# Patient Record
Sex: Male | Born: 1965 | Race: White | Hispanic: No | Marital: Married | Smoking: Former smoker
Health system: Southern US, Community
[De-identification: ages and names within clinical notes are randomized; demographics above are authoritative.]

## PROBLEM LIST (undated history)

## (undated) DIAGNOSIS — B9562 Methicillin resistant Staphylococcus aureus infection as the cause of diseases classified elsewhere: Secondary | ICD-10-CM

## (undated) DIAGNOSIS — L0390 Cellulitis, unspecified: Secondary | ICD-10-CM

## (undated) DIAGNOSIS — K219 Gastro-esophageal reflux disease without esophagitis: Secondary | ICD-10-CM

## (undated) DIAGNOSIS — Z8719 Personal history of other diseases of the digestive system: Secondary | ICD-10-CM

## (undated) DIAGNOSIS — I319 Disease of pericardium, unspecified: Secondary | ICD-10-CM

## (undated) HISTORY — DX: Personal history of other diseases of the digestive system: Z87.19

## (undated) HISTORY — DX: Cellulitis, unspecified: L03.90

## (undated) HISTORY — PX: HEMORRHOID SURGERY: SHX153

## (undated) HISTORY — DX: Disease of pericardium, unspecified: I31.9

## (undated) HISTORY — DX: Methicillin resistant Staphylococcus aureus infection as the cause of diseases classified elsewhere: B95.62

## (undated) HISTORY — DX: Gastro-esophageal reflux disease without esophagitis: K21.9

---

## 2006-01-05 DIAGNOSIS — B9562 Methicillin resistant Staphylococcus aureus infection as the cause of diseases classified elsewhere: Secondary | ICD-10-CM

## 2006-01-05 HISTORY — DX: Methicillin resistant Staphylococcus aureus infection as the cause of diseases classified elsewhere: B95.62

## 2011-01-06 HISTORY — PX: SIGMOIDOSCOPY: SUR1295

## 2011-01-06 LAB — HM COLONOSCOPY

## 2011-01-29 LAB — HM SIGMOIDOSCOPY

## 2013-10-04 ENCOUNTER — Ambulatory Visit (INDEPENDENT_AMBULATORY_CARE_PROVIDER_SITE_OTHER): Payer: 59 | Admitting: Internal Medicine

## 2013-10-04 ENCOUNTER — Encounter: Payer: Self-pay | Admitting: Internal Medicine

## 2013-10-04 VITALS — BP 114/78 | HR 69 | Temp 98.8°F | Resp 14 | Ht 66.5 in | Wt 250.0 lb

## 2013-10-04 DIAGNOSIS — Z0000 Encounter for general adult medical examination without abnormal findings: Secondary | ICD-10-CM | POA: Insufficient documentation

## 2013-10-04 DIAGNOSIS — K219 Gastro-esophageal reflux disease without esophagitis: Secondary | ICD-10-CM

## 2013-10-04 NOTE — Progress Notes (Signed)
Subjective:    Patient ID: Carl Ramos Needs, male    DOB: 1965/02/22, 48 y.o.   MRN: [id number]  HPI Here to establish Moved here from [place] in April Took job here with [employer]  Has had stress with career change, moving, etc Had been awakening with reflux and breathing problems MD did barium swallow -- diagnosed with acid reflux Now sleeping elevated Has been taking the omeprazole daily No swallowing problems  Has had some hemorrhoids removed--- ~10 years ago Recurrent blood in stool--had sigmoidoscopy then--nothing worrisome found (2014)  No current outpatient prescriptions on file prior to visit.   No current facility-administered medications on file prior to visit.    No Known Allergies  Past Medical History  Diagnosis Date  . History of hemorrhoids   . GERD (gastroesophageal reflux disease)   . MRSA cellulitis 2008    hospitalized for presumed MRSA infection    Past Surgical History  Procedure Laterality Date  . Hemorrhoid surgery  ~2005    Family History  Problem Relation Age of Onset  . Cancer Mother 1862    colon  . COPD Mother   . Early death Father [id number]  . Aneurysm Father     cerebral  . Heart disease Maternal Grandfather   . Diabetes Paternal Grandmother     History   Social History  . Marital Status: Married    Spouse Name: N/A    Number of Children: 2  . 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 Smoking status: Former Games developermoker  . Smokeless tobacco: Never Used  . Alcohol Use: Yes  . Drug Use: No  . Sexual Activity: Not on file   Other Topics Concern  . Not on file   Social History Narrative  . No narrative on file   Review of Systems  Constitutional: Positive for unexpected weight change. Negative for fatigue.       Has gained 10# since the move here Wears seat belt  HENT: Negative for dental problem, hearing loss and tinnitus.        Regular with dentist  Eyes: Negative for  visual disturbance.       No diplopia or unilateral vision loss  Respiratory: Negative for cough, chest tightness and shortness of breath.   Cardiovascular: Negative for chest pain, palpitations and leg swelling.  Gastrointestinal: Negative for nausea, vomiting, abdominal pain, constipation and blood in stool.  Endocrine: Negative for polydipsia and polyuria.  Genitourinary: Negative for urgency, frequency and difficulty urinating.       No sexual problems  Musculoskeletal: Negative for arthralgias, back pain and joint swelling.  Skin: Negative for rash.       No suspicious lesions  Allergic/Immunologic: Negative for environmental allergies and immunocompromised state.  Neurological: Negative for dizziness, syncope, weakness, light-headedness, numbness and headaches.  Psychiatric/Behavioral: Negative for sleep disturbance and dysphoric mood. The patient is not nervous/anxious.        Objective:   Physical Exam  Constitutional: He is oriented to person, place, and time. He appears well-developed and well-nourished. No distress.  HENT:  Head: Normocephalic and atraumatic.  Right Ear: External ear normal.  Left Ear: External ear normal.  Mouth/Throat: Oropharynx is clear and moist. No oropharyngeal exudate.  Eyes: Conjunctivae and EOM are normal. Pupils are equal, round, and reactive to light.  Neck: Normal range of motion. Neck supple. No thyromegaly present.  Cardiovascular: Normal rate, regular rhythm, normal heart sounds  and intact distal pulses.  Exam reveals no gallop.   No murmur heard. Pulmonary/Chest: Effort normal and breath sounds normal. No respiratory distress. He has no wheezes. He has no rales.  Abdominal: Soft. There is no tenderness.  Musculoskeletal: He exhibits no edema and no tenderness.  Lymphadenopathy:    He has no cervical adenopathy.  Neurological: He is alert and oriented to person, place, and time.  Skin: No rash noted. No erythema.  Psychiatric: He has a  normal mood and affect. His behavior is normal.          Assessment & Plan:

## 2013-10-04 NOTE — Progress Notes (Signed)
Pre visit review using our clinic review tool, if applicable. No additional management support is needed unless otherwise documented below in the visit note. 

## 2013-10-04 NOTE — Assessment & Plan Note (Signed)
Quiet now Discussed weaning down on the omeprazole

## 2013-10-04 NOTE — Assessment & Plan Note (Signed)
Healthy but needs to work on fitness UTD with Td Flu vaccine today He will get a copy of the recent labs he had

## 2014-01-09 ENCOUNTER — Encounter: Payer: Self-pay | Admitting: Internal Medicine

## 2014-01-12 ENCOUNTER — Encounter: Payer: Self-pay | Admitting: Internal Medicine

## 2014-06-29 ENCOUNTER — Telehealth: Payer: Self-pay | Admitting: Internal Medicine

## 2014-06-29 NOTE — Telephone Encounter (Signed)
Patient needs to have his physical done for work by the end of August.  Can Patient be fit in before September to see you or can he see another provider?

## 2014-06-30 NOTE — Telephone Encounter (Signed)
Please fit him in somewhere

## 2014-10-10 ENCOUNTER — Ambulatory Visit (INDEPENDENT_AMBULATORY_CARE_PROVIDER_SITE_OTHER): Payer: 59 | Admitting: Internal Medicine

## 2014-10-10 ENCOUNTER — Encounter: Payer: Self-pay | Admitting: Internal Medicine

## 2014-10-10 VITALS — BP 120/70 | HR 58 | Temp 97.7°F | Ht 67.0 in | Wt 251.0 lb

## 2014-10-10 DIAGNOSIS — K219 Gastro-esophageal reflux disease without esophagitis: Secondary | ICD-10-CM

## 2014-10-10 DIAGNOSIS — Z23 Encounter for immunization: Secondary | ICD-10-CM

## 2014-10-10 DIAGNOSIS — Z0000 Encounter for general adult medical examination without abnormal findings: Secondary | ICD-10-CM

## 2014-10-10 NOTE — Progress Notes (Signed)
Pre visit review using our clinic review tool, if applicable. No additional management support is needed unless otherwise documented below in the visit note. 

## 2014-10-10 NOTE — Addendum Note (Signed)
Addended by: Sueanne Margarita on: 10/10/2014 03:20 PM   Modules accepted: Orders

## 2014-10-10 NOTE — Assessment & Plan Note (Signed)
Quiet now Uses the prilosec prn

## 2014-10-10 NOTE — Progress Notes (Signed)
Subjective:    Patient ID: Carl Ramos, male    DOB: Dec 05, 1965, 49 y.o.   MRN: [id number]  HPI Here for physical Did have recent wellness at work---will get results for me  Recent vacation and gained 10# Working more on exercise and healthy eating  Still on omeprazole--but uses only prn No heartburn or dysphagia No nocturnal dyspnea now--elevates HOB with pillows  Current Outpatient Prescriptions on File Prior to Visit  Medication Sig Dispense Refill  . Multiple Vitamin (MULTIVITAMIN) tablet Take 1 tablet by mouth daily.    Marland Kitchen omeprazole (PRILOSEC) 20 MG capsule Take 20 mg by mouth daily.     No current facility-administered medications on file prior to visit.    No Known Allergies  Past Medical History  Diagnosis Date  . History of hemorrhoids   . GERD (gastroesophageal reflux disease)   . MRSA cellulitis 2008    hospitalized for presumed MRSA infection    Past Surgical History  Procedure Laterality Date  . Hemorrhoid surgery  ~2005  . Sigmoidoscopy  1/13    normal    Family History  Problem Relation Age of Onset  . Cancer Mother 77    colon  . COPD Mother   . Early death Father 41  . Aneurysm Father     cerebral  . Heart disease Maternal Grandfather   . Diabetes Paternal Grandmother     Social History   Social History  . Marital Status: Married    Spouse Name: N/A  . Number of Children: 2  . 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 Smoking status: Former Games developer  . Smokeless tobacco: Never Used  . Alcohol Use: Yes  . Drug Use: No  . Sexual Activity: Not on file   Other Topics Concern  . Not on file   Social History Narrative   Review of Systems  Constitutional: Negative for fatigue and unexpected weight change.       Wears seat belt  HENT: Negative for dental problem, hearing loss, tinnitus and trouble swallowing.        Keeps up with dentist  Eyes: Negative for visual  disturbance.       No diplopia or unilateral vision loss  Respiratory: Negative for cough, chest tightness and shortness of breath.   Cardiovascular: Negative for chest pain, palpitations and leg swelling.  Gastrointestinal: Positive for anal bleeding. Negative for nausea, vomiting, abdominal pain, constipation and blood in stool.       Brief rectal bleeding --- due to hemorrhoid Has had past colonoscopy  Endocrine: Negative for polydipsia and polyuria.  Genitourinary: Negative for urgency and difficulty urinating.       No sexual problems  Musculoskeletal: Negative for back pain, joint swelling and arthralgias.  Skin: Positive for rash.       Saw dermatologist--needed lesion frozen on forehead. Cream for dryness in feet  Allergic/Immunologic: Negative for environmental allergies and immunocompromised state.  Neurological: Negative for dizziness, syncope, weakness, light-headedness and headaches.  Hematological: Negative for adenopathy. Does not bruise/bleed easily.  Psychiatric/Behavioral: Negative for sleep disturbance and dysphoric mood. The patient is not nervous/anxious.        Objective:   Physical Exam  Constitutional: He is oriented to person, place, and time. He appears well-developed and well-nourished. No distress.  HENT:  Head: Normocephalic and atraumatic.  Right Ear: External ear normal.  Left Ear: External ear normal.  Mouth/Throat: Oropharynx is  clear and moist. No oropharyngeal exudate.  Eyes: Conjunctivae and EOM are normal. Pupils are equal, round, and reactive to light.  Neck: Normal range of motion. Neck supple. No thyromegaly present.  Cardiovascular: Normal rate, regular rhythm, normal heart sounds and intact distal pulses.  Exam reveals no gallop.   No murmur heard. Pulmonary/Chest: Effort normal and breath sounds normal. No respiratory distress. He has no wheezes. He has no rales.  Abdominal: Soft. There is no tenderness.  Musculoskeletal: He exhibits no  edema or tenderness.  Lymphadenopathy:    He has no cervical adenopathy.  Neurological: He is alert and oriented to person, place, and time.  Skin: No rash noted. No erythema.  Psychiatric: He has a normal mood and affect. His behavior is normal.          Assessment & Plan:

## 2014-10-10 NOTE — Assessment & Plan Note (Signed)
Healthy  Discussed fitness He will get me the results of his lab work Flu vaccine today Colonoscopy again at age 49 (FH colon cancer)

## 2015-02-15 ENCOUNTER — Telehealth: Payer: Self-pay | Admitting: Internal Medicine

## 2015-02-15 NOTE — Telephone Encounter (Signed)
Form on your desk  

## 2015-02-15 NOTE — Telephone Encounter (Signed)
Pt dropped off labs for your review.  Placing in rx tower Thank you

## 2015-02-18 NOTE — Telephone Encounter (Signed)
Please let him know his labs all look fine---the cholesterol is just mildly elevated and everything else is normal. Cholesterol levels were basically unchanged since 2015

## 2015-02-19 NOTE — Telephone Encounter (Signed)
Spoke with patient and advised results   

## 2015-02-25 ENCOUNTER — Encounter: Payer: Self-pay | Admitting: Internal Medicine

## 2015-04-10 ENCOUNTER — Encounter: Payer: Self-pay | Admitting: Family Medicine

## 2015-04-10 ENCOUNTER — Ambulatory Visit (INDEPENDENT_AMBULATORY_CARE_PROVIDER_SITE_OTHER): Payer: 59 | Admitting: Family Medicine

## 2015-04-10 VITALS — BP 110/70 | HR 76 | Temp 98.9°F | Wt 248.5 lb

## 2015-04-10 DIAGNOSIS — R059 Cough, unspecified: Secondary | ICD-10-CM

## 2015-04-10 DIAGNOSIS — R05 Cough: Secondary | ICD-10-CM

## 2015-04-10 MED ORDER — ALBUTEROL SULFATE HFA 108 (90 BASE) MCG/ACT IN AERS: 1.0000 | INHALATION_SPRAY | Freq: Four times a day (QID) | RESPIRATORY_TRACT | Status: DC | PRN

## 2015-04-10 MED ORDER — AZITHROMYCIN 250 MG PO TABS: ORAL_TABLET | ORAL | Status: DC

## 2015-04-10 NOTE — Progress Notes (Signed)
Pre visit review using our clinic review tool, if applicable. No additional management support is needed unless otherwise documented below in the visit note.  Sick for about 5 days.  Cough initially. Mult sick contacts.  Chest congestion.  Yellow sputum.  Felt feverish.  Taking tylenol.  Fatigue.  Some fatigue.  Distant smoker.  Never had to use an inhaler prev.  Some B ear pain.  Some ST.  No vomiting, no diarrhea.  No rash.  His family members are getting better.  He is a little better today than yesterday, but he was out of work today (planned day off).    He was in [place] this past winter.  Had cardiac w/u for chest sx.  He didn't have MI but had pericarditis which has resolved.  I asked him to drop off any records if he is going to stay here but he's moving back to [place] soon.    Meds, vitals, and allergies reviewed.   ROS: See HPI.  Otherwise, noncontributory.  GEN: nad, alert and oriented HEENT: mucous membranes moist, tm w/o erythema, nasal exam w/o erythema, clear discharge noted,  OP with cobblestoning NECK: supple w/o LA CV: rrr.   PULM: ctab, no inc wob EXT: no edema SKIN: no acute rash

## 2015-04-10 NOTE — Patient Instructions (Signed)
Use the cough syrup you have.   If not better, then try the inhaler.  Start the antibiotics if you continue have discolored sputum over the next few days.  Take care.  Glad to see you.

## 2015-04-10 NOTE — Assessment & Plan Note (Signed)
Nontoxic.  Some better today.  Use SABA prn with routine cautions.  Start zmax if continuing to have discolored sputum over the next few days.  Okay for outpatient f/u.  He agrees.

## 2015-04-16 ENCOUNTER — Emergency Department
Admission: EM | Admit: 2015-04-16 | Discharge: 2015-04-16 | Disposition: A | Discharge status: Home / Self Care | Payer: 59 | Attending: Emergency Medicine | Admitting: Emergency Medicine

## 2015-04-16 ENCOUNTER — Telehealth: Payer: Self-pay | Admitting: Internal Medicine

## 2015-04-16 ENCOUNTER — Emergency Department: Payer: 59

## 2015-04-16 DIAGNOSIS — A4902 Methicillin resistant Staphylococcus aureus infection, unspecified site: Secondary | ICD-10-CM | POA: Diagnosis not present

## 2015-04-16 DIAGNOSIS — W010XXA Fall on same level from slipping, tripping and stumbling without subsequent striking against object, initial encounter: Secondary | ICD-10-CM | POA: Insufficient documentation

## 2015-04-16 DIAGNOSIS — S50812A Abrasion of left forearm, initial encounter: Secondary | ICD-10-CM | POA: Diagnosis not present

## 2015-04-16 DIAGNOSIS — Y999 Unspecified external cause status: Secondary | ICD-10-CM | POA: Insufficient documentation

## 2015-04-16 DIAGNOSIS — S50811A Abrasion of right forearm, initial encounter: Secondary | ICD-10-CM | POA: Diagnosis not present

## 2015-04-16 DIAGNOSIS — S81811A Laceration without foreign body, right lower leg, initial encounter: Secondary | ICD-10-CM

## 2015-04-16 DIAGNOSIS — Y929 Unspecified place or not applicable: Secondary | ICD-10-CM | POA: Insufficient documentation

## 2015-04-16 DIAGNOSIS — S96911A Strain of unspecified muscle and tendon at ankle and foot level, right foot, initial encounter: Secondary | ICD-10-CM

## 2015-04-16 DIAGNOSIS — S70311A Abrasion, right thigh, initial encounter: Secondary | ICD-10-CM | POA: Diagnosis not present

## 2015-04-16 DIAGNOSIS — K219 Gastro-esophageal reflux disease without esophagitis: Secondary | ICD-10-CM | POA: Insufficient documentation

## 2015-04-16 DIAGNOSIS — Y9389 Activity, other specified: Secondary | ICD-10-CM | POA: Diagnosis not present

## 2015-04-16 DIAGNOSIS — Z87891 Personal history of nicotine dependence: Secondary | ICD-10-CM | POA: Diagnosis not present

## 2015-04-16 DIAGNOSIS — S8391XA Sprain of unspecified site of right knee, initial encounter: Secondary | ICD-10-CM

## 2015-04-16 DIAGNOSIS — S80811A Abrasion, right lower leg, initial encounter: Secondary | ICD-10-CM

## 2015-04-16 MED ORDER — IBUPROFEN 800 MG PO TABS: 800.0000 mg | ORAL_TABLET | Freq: Three times a day (TID) | ORAL | Status: AC | PRN

## 2015-04-16 MED ORDER — MUPIROCIN 2 % EX OINT: 1.0000 "application " | TOPICAL_OINTMENT | Freq: Two times a day (BID) | CUTANEOUS | Status: DC

## 2015-04-16 NOTE — Telephone Encounter (Signed)
He probably needs to be rechecked to figure out what is going on. It is likely that he has a viral infection--so the azithromycin wouldn't be expected to help that. If he is about the same, without fever or shortness of breath, he might want to wait it out. But if he is worse, he should be seen (before his travel if possible)
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
## 2015-04-16 NOTE — Discharge Instructions (Signed)
Abrasion An abrasion is a cut or scrape on the outer surface of your skin. An abrasion does not extend through all of the layers of your skin. It is important to care for your abrasion properly to prevent infection. CAUSES Most abrasions are caused by falling on or gliding across the ground or another surface. When your skin rubs on something, the outer and inner layer of skin rubs off.  SYMPTOMS A cut or scrape is the main symptom of this condition. The scrape may be bleeding, or it may appear red or pink. If there was an associated fall, there may be an underlying bruise. DIAGNOSIS An abrasion is diagnosed with a physical exam. TREATMENT Treatment for this condition depends on how large and deep the abrasion is. Usually, your abrasion will be cleaned with water and mild soap. This removes any dirt or debris that may be stuck. An antibiotic ointment may be applied to the abrasion to help prevent infection. A bandage (dressing) may be placed on the abrasion to keep it clean. You may also need a tetanus shot. HOME CARE INSTRUCTIONS Medicines  Take or apply medicines only as directed by your health care provider.  If you were prescribed an antibiotic ointment, finish all of it even if you start to feel better. Wound Care  Clean the wound with mild soap and water 2-3 times per day or as directed by your health care provider. Pat your wound dry with a clean towel. Do not rub it.  There are many different ways to close and cover a wound. Follow instructions from your health care provider about:  Wound care.  Dressing changes and removal.  Check your wound every day for signs of infection. Watch for:  Redness, swelling, or pain.  Fluid, blood, or pus. General Instructions  Keep the dressing dry as directed by your health care provider. Do not take baths, swim, use a hot tub, or do anything that would put your wound underwater until your health care provider approves.  If there is  swelling, raise (elevate) the injured area above the level of your heart while you are sitting or lying down.  Keep all follow-up visits as directed by your health care provider. This is important. SEEK MEDICAL CARE IF:  You received a tetanus shot and you have swelling, severe pain, redness, or bleeding at the injection site.  Your pain is not controlled with medicine.  You have increased redness, swelling, or pain at the site of your wound. SEEK IMMEDIATE MEDICAL CARE IF:  You have a red streak going away from your wound.  You have a fever.  You have fluid, blood, or pus coming from your wound.  You notice a bad smell coming from your wound or your dressing.   This information is not intended to replace advice given to you by your health care provider. Make sure you discuss any questions you have with your health care provider.   Document Released: 10/01/2004 Document Revised: 09/12/2014 Document Reviewed: 12/20/2013 Elsevier Interactive Patient Education 2016 [employer]  Acute Ankle Sprain With Phase I Rehab An acute ankle sprain is a partial or complete tear in one or more of the ligaments of the ankle due to traumatic injury. The severity of the injury depends on both the number of ligaments sprained and the grade of sprain. There are 3 grades of sprains.   A grade 1 sprain is a mild sprain. There is a slight pull without obvious tearing. There is no loss  of strength, and the muscle and ligament are the correct length.  A grade 2 sprain is a moderate sprain. There is tearing of fibers within the substance of the ligament where it connects two bones or two cartilages. The length of the ligament is increased, and there is usually decreased strength.  A grade 3 sprain is a complete rupture of the ligament and is uncommon. In addition to the grade of sprain, there are three types of ankle sprains.  Lateral ankle sprains: This is a sprain of one or more of the three ligaments on  the outer side (lateral) of the ankle. These are the most common sprains. Medial ankle sprains: There is one large triangular ligament of the inner side (medial) of the ankle that is susceptible to injury. Medial ankle sprains are less common. Syndesmosis, "high ankle," sprains: The syndesmosis is the ligament that connects the two bones of the lower leg. Syndesmosis sprains usually only occur with very severe ankle sprains. SYMPTOMS  Pain, tenderness, and swelling in the ankle, starting at the side of injury that may progress to the whole ankle and foot with time.  "Pop" or tearing sensation at the time of injury.  Bruising that may spread to the heel.  Impaired ability to walk soon after injury. CAUSES   Acute ankle sprains are caused by trauma placed on the ankle that temporarily forces or pries the anklebone (talus) out of its normal socket.  Stretching or tearing of the ligaments that normally hold the joint in place (usually due to a twisting injury). RISK INCREASES WITH:  Previous ankle sprain.  Sports in which the foot may land awkwardly (i.e., basketball, volleyball, or soccer) or walking or running on uneven or rough surfaces.  Shoes with inadequate support to prevent sideways motion when stress occurs.  Poor strength and flexibility.  Poor balance skills.  Contact sports. PREVENTION   Warm up and stretch properly before activity.  Maintain physical fitness:  Ankle and leg flexibility, muscle strength, and endurance.  Cardiovascular fitness.  Balance training activities.  Use proper technique and have a coach correct improper technique.  Taping, protective strapping, bracing, or high-top tennis shoes may help prevent injury. Initially, tape is best; however, it loses most of its support function within 10 to 15 minutes.  Wear proper-fitted protective shoes (High-top shoes with taping or bracing is more effective than either alone).  Provide the ankle with  support during sports and practice activities for 12 months following injury. PROGNOSIS   If treated properly, ankle sprains can be expected to recover completely; however, the length of recovery depends on the degree of injury.  A grade 1 sprain usually heals enough in 5 to 7 days to allow modified activity and requires an average of 6 weeks to heal completely.  A grade 2 sprain requires 6 to 10 weeks to heal completely.  A grade 3 sprain requires 12 to 16 weeks to heal.  A syndesmosis sprain often takes more than 3 months to heal. RELATED COMPLICATIONS   Frequent recurrence of symptoms may result in a chronic problem. Appropriately addressing the problem the first time decreases the frequency of recurrence and optimizes healing time. Severity of the initial sprain does not predict the likelihood of later instability.  Injury to other structures (bone, cartilage, or tendon).  A chronically unstable or arthritic ankle joint is a possibility with repeated sprains. TREATMENT Treatment initially involves the use of ice, medication, and compression bandages to help reduce pain and inflammation. Ankle  sprains are usually immobilized in a walking cast or boot to allow for healing. Crutches may be recommended to reduce pressure on the injury. After immobilization, strengthening and stretching exercises may be necessary to regain strength and a full range of motion. Surgery is rarely needed to treat ankle sprains. MEDICATION   Nonsteroidal anti-inflammatory medications, such as aspirin and ibuprofen (do not take for the first 3 days after injury or within 7 days before surgery), or other minor pain relievers, such as acetaminophen, are often recommended. Take these as directed by your caregiver. Contact your caregiver immediately if any bleeding, stomach upset, or signs of an allergic reaction occur from these medications.  Ointments applied to the skin may be helpful.  Pain relievers may be  prescribed as necessary by your caregiver. Do not take prescription pain medication for longer than 4 to 7 days. Use only as directed and only as much as you need. HEAT AND COLD  Cold treatment (icing) is used to relieve pain and reduce inflammation for acute and chronic cases. Cold should be applied for 10 to 15 minutes every 2 to 3 hours for inflammation and pain and immediately after any activity that aggravates your symptoms. Use ice packs or an ice massage.  Heat treatment may be used before performing stretching and strengthening activities prescribed by your caregiver. Use a heat pack or a warm soak. SEEK IMMEDIATE MEDICAL CARE IF:   Pain, swelling, or bruising worsens despite treatment.  You experience pain, numbness, discoloration, or coldness in the foot or toes.  New, unexplained symptoms develop (drugs used in treatment may produce side effects.) EXERCISES  PHASE I EXERCISES RANGE OF MOTION (ROM) AND STRETCHING EXERCISES - Ankle Sprain, Acute Phase I, Weeks 1 to 2 These exercises may help you when beginning to restore flexibility in your ankle. You will likely work on these exercises for the 1 to 2 weeks after your injury. Once your physician, physical therapist, or athletic trainer sees adequate progress, he or she will advance your exercises. While completing these exercises, remember:   Restoring tissue flexibility helps normal motion to return to the joints. This allows healthier, less painful movement and activity.  An effective stretch should be held for at least 30 seconds.  A stretch should never be painful. You should only feel a gentle lengthening or release in the stretched tissue. RANGE OF MOTION - Dorsi/Plantar Flexion  While sitting with your right / left knee straight, draw the top of your foot upwards by flexing your ankle. Then reverse the motion, pointing your toes downward.  Hold each position for __________ seconds.  After completing your first set of  exercises, repeat this exercise with your knee bent. Repeat __________ times. Complete this exercise __________ times per day.  RANGE OF MOTION - Ankle Alphabet  Imagine your right / left big toe is a pen.  Keeping your hip and knee still, write out the entire alphabet with your "pen." Make the letters as large as you can without increasing any discomfort. Repeat __________ times. Complete this exercise __________ times per day.  STRENGTHENING EXERCISES - Ankle Sprain, Acute -Phase I, Weeks 1 to 2 These exercises may help you when beginning to restore strength in your ankle. You will likely work on these exercises for 1 to 2 weeks after your injury. Once your physician, physical therapist, or athletic trainer sees adequate progress, he or she will advance your exercises. While completing these exercises, remember:   Muscles can gain both the endurance and  the strength needed for everyday activities through controlled exercises.  Complete these exercises as instructed by your physician, physical therapist, or athletic trainer. Progress the resistance and repetitions only as guided.  You may experience muscle soreness or fatigue, but the pain or discomfort you are trying to eliminate should never worsen during these exercises. If this pain does worsen, stop and make certain you are following the directions exactly. If the pain is still present after adjustments, discontinue the exercise until you can discuss the trouble with your clinician. STRENGTH - Dorsiflexors  Secure a rubber exercise band/tubing to a fixed object (i.e., table, pole) and loop the other end around your right / left foot.  Sit on the floor facing the fixed object. The band/tubing should be slightly tense when your foot is relaxed.  Slowly draw your foot back toward you using your ankle and toes.  Hold this position for __________ seconds. Slowly release the tension in the band and return your foot to the starting  position. Repeat __________ times. Complete this exercise __________ times per day.  STRENGTH - Plantar-flexors   Sit with your right / left leg extended. Holding onto both ends of a rubber exercise band/tubing, loop it around the ball of your foot. Keep a slight tension in the band.  Slowly push your toes away from you, pointing them downward.  Hold this position for __________ seconds. Return slowly, controlling the tension in the band/tubing. Repeat __________ times. Complete this exercise __________ times per day.  STRENGTH - Ankle Eversion  Secure one end of a rubber exercise band/tubing to a fixed object (table, pole). Loop the other end around your foot just before your toes.  Place your fists between your knees. This will focus your strengthening at your ankle.  Drawing the band/tubing across your opposite foot, slowly, pull your little toe out and up. Make sure the band/tubing is positioned to resist the entire motion.  Hold this position for __________ seconds. Have your muscles resist the band/tubing as it slowly pulls your foot back to the starting position.  Repeat __________ times. Complete this exercise __________ times per day.  STRENGTH - Ankle Inversion  Secure one end of a rubber exercise band/tubing to a fixed object (table, pole). Loop the other end around your foot just before your toes.  Place your fists between your knees. This will focus your strengthening at your ankle.  Slowly, pull your big toe up and in, making sure the band/tubing is positioned to resist the entire motion.  Hold this position for __________ seconds.  Have your muscles resist the band/tubing as it slowly pulls your foot back to the starting position. Repeat __________ times. Complete this exercises __________ times per day.  STRENGTH - Towel Curls  Sit in a chair positioned on a non-carpeted surface.  Place your right / left foot on a towel, keeping your heel on the floor.  Pull the  towel toward your heel by only curling your toes. Keep your heel on the floor.  If instructed by your physician, physical therapist, or athletic trainer, add weight to the end of the towel. Repeat __________ times. Complete this exercise __________ times per day.   This information is not intended to replace advice given to you by your health care provider. Make sure you discuss any questions you have with your health care provider.   Document Released: 07/23/2004 Document Revised: 01/12/2014 Document Reviewed: 04/05/2008 Elsevier Interactive Patient Education 2016 [employer]  Cryotherapy Cryotherapy is when you  put ice on your injury. Ice helps lessen pain and puffiness (swelling) after an injury. Ice works the best when you start using it in the first 24 to 48 hours after an injury. HOME CARE  Put a dry or damp towel between the ice pack and your skin.  You may press gently on the ice pack.  Leave the ice on for no more than 10 to 20 minutes at a time.  Check your skin after 5 minutes to make sure your skin is okay.  Rest at least 20 minutes between ice pack uses.  Stop using ice when your skin loses feeling (numbness).  Do not use ice on someone who cannot tell you when it hurts. This includes small children and people with memory problems (dementia). GET HELP RIGHT AWAY IF:  You have white spots on your skin.  Your skin turns blue or pale.  Your skin feels waxy or hard.  Your puffiness gets worse. MAKE SURE YOU:   Understand these instructions.  Will watch your condition.  Will get help right away if you are not doing well or get worse.   This information is not intended to replace advice given to you by your health care provider. Make sure you discuss any questions you have with your health care provider.   Document Released: 06/10/2007 Document Revised: 03/16/2011 Document Reviewed: 08/14/2010 Elsevier Interactive Patient Education 2016 [employer]  Inc[employer]  Adult nurse and RICE WHAT DOES AN ELASTIC BANDAGE DO? Elastic bandages come in different shapes and sizes. They generally provide support to your injury and reduce swelling while you are healing, but they can perform different functions. Your health care provider will help you to decide what is best for your protection, recovery, or rehabilitation following an injury. WHAT ARE SOME GENERAL TIPS FOR USING AN ELASTIC BANDAGE?  Use the bandage as directed by the maker of the bandage that you are using.  Do not wrap the bandage too tightly. This may cut off the circulation in the arm or leg in the area below the bandage.  If part of your body beyond the bandage becomes blue, numb, cold, swollen, or is more painful, your bandage is most likely too tight. If this occurs, remove your bandage and reapply it more loosely.  See your health care provider if the bandage seems to be making your problems worse rather than better.  An elastic bandage should be removed and reapplied every 3-4 hours or as directed by your health care provider. WHAT IS RICE? The routine care of many injuries includes rest, ice, compression, and elevation (RICE therapy).  Rest Rest is required to allow your body to heal. Generally, you can resume your routine activities when you are comfortable and have been given permission by your health care provider. Ice Icing your injury helps to keep the swelling down and it reduces pain. Do not apply ice directly to your skin.  Put ice in a plastic bag.  Place a towel between your skin and the bag.  Leave the ice on for 20 minutes, 2-3 times per day. Do this for as long as you are directed by your health care provider. Compression Compression helps to keep swelling down, gives support, and helps with discomfort. Compression may be done with an elastic bandage. Elevation Elevation helps to reduce swelling and it decreases pain. If possible, your injured area should be  placed at or above the level of your heart or the center of your chest. WHEN SHOULD I  SEEK MEDICAL CARE? You should seek medical care if:  You have persistent pain and swelling.  Your symptoms are getting worse rather than improving. These symptoms may indicate that further evaluation or further X-rays are needed. Sometimes, X-rays may not show a small broken bone (fracture) until a number of days later. Make a follow-up appointment with your health care provider. Ask when your X-ray results will be ready. Make sure that you get your X-ray results. WHEN SHOULD I SEEK IMMEDIATE MEDICAL CARE? You should seek immediate medical care if:  You have a sudden onset of severe pain at or below the area of your injury.  You develop redness or increased swelling around your injury.  You have tingling or numbness at or below the area of your injury that does not improve after you remove the elastic bandage.   This information is not intended to replace advice given to you by your health care provider. Make sure you discuss any questions you have with your health care provider.   Document Released: 06/13/2001 Document Revised: 09/12/2014 Document Reviewed: 08/07/2013 Elsevier Interactive Patient Education 2016 [employer]  Knee Sprain A knee sprain is a tear in one of the strong, fibrous tissues that connect the bones (ligaments) in your knee. The severity of the sprain depends on how much of the ligament is torn. The tear can be either partial or complete. CAUSES  Often, sprains are a result of a fall or injury. The force of the impact causes the fibers of your ligament to stretch too much. This excess tension causes the fibers of your ligament to tear. SIGNS AND SYMPTOMS  You may have some loss of motion in your knee. Other symptoms include:  Bruising.  Pain in the knee area.  Tenderness of the knee to the touch.  Swelling. DIAGNOSIS  To diagnose a knee sprain, your health care provider  will physically examine your knee. Your health care provider may also suggest an X-ray exam of your knee to make sure no bones are broken. TREATMENT  If your ligament is only partially torn, treatment usually involves keeping the knee in a fixed position (immobilization) or bracing your knee for activities that require movement for several weeks. To do this, your health care provider will apply a bandage, cast, or splint to keep your knee from moving and to support your knee during movement until it heals. For a partially torn ligament, the healing process usually takes 4-6 weeks. If your ligament is completely torn, depending on which ligament it is, you may need surgery to reconnect the ligament to the bone or reconstruct it. After surgery, a cast or splint may be applied and will need to stay on your knee for 4-6 weeks while your ligament heals. HOME CARE INSTRUCTIONS  Keep your injured knee elevated to decrease swelling.  To ease pain and swelling, apply ice to the injured area:  Put ice in a plastic bag.  Place a towel between your skin and the bag.  Leave the ice on for 20 minutes, 2-3 times a day.  Only take medicine for pain as directed by your health care provider.  Do not leave your knee unprotected until pain and stiffness go away (usually 4-6 weeks).  If you have a cast or splint, do not allow it to get wet. If you have been instructed not to remove it, cover it with a plastic bag when you shower or bathe. Do not swim.  Your health care provider may suggest exercises  for you to do during your recovery to prevent or limit permanent weakness and stiffness. SEEK IMMEDIATE MEDICAL CARE IF:  Your cast or splint becomes damaged.  Your pain becomes worse.  You have significant pain, swelling, or numbness below the cast or splint. MAKE SURE YOU:  Understand these instructions.  Will watch your condition.  Will get help right away if you are not doing well or get worse.    This information is not intended to replace advice given to you by your health care provider. Make sure you discuss any questions you have with your health care provider.   Document Released: 12/22/2004 Document Revised: 01/12/2014 Document Reviewed: 08/03/2012 Elsevier Interactive Patient Education 2016 [employer]  Wound Care Taking care of your wound properly can help to prevent pain and infection. It can also help your wound to heal more quickly.  HOW TO CARE FOR YOUR WOUND  Take or apply over-the-counter and prescription medicines only as told by your health care provider.  If you were prescribed antibiotic medicine, take or apply it as told by your health care provider. Do not stop using the antibiotic even if your condition improves.  Clean the wound each day or as told by your health care provider.  Wash the wound with mild soap and water.  Rinse the wound with water to remove all soap.  Pat the wound dry with a clean towel. Do not rub it.  There are many different ways to close and cover a wound. For example, a wound can be covered with stitches (sutures), skin glue, or adhesive strips. Follow instructions from your health care provider about:  How to take care of your wound.  When and how you should change your bandage (dressing).  When you should remove your dressing.  Removing whatever was used to close your wound.  Check your wound every day for signs of infection. Watch for:  Redness, swelling, or pain.  Fluid, blood, or pus.  Keep the dressing dry until your health care provider says it can be removed. Do not take baths, swim, use a hot tub, or do anything that would put your wound underwater until your health care provider approves.  Raise (elevate) the injured area above the level of your heart while you are sitting or lying down.  Do not scratch or pick at the wound.  Keep all follow-up visits as told by your health care provider. This is  important. SEEK MEDICAL CARE IF:  You received a tetanus shot and you have swelling, severe pain, redness, or bleeding at the injection site.  You have a fever.  Your pain is not controlled with medicine.  You have increased redness, swelling, or pain at the site of your wound.  You have fluid, blood, or pus coming from your wound.  You notice a bad smell coming from your wound or your dressing. SEEK IMMEDIATE MEDICAL CARE IF:  You have a red streak going away from your wound.   This information is not intended to replace advice given to you by your health care provider. Make sure you discuss any questions you have with your health care provider.   Document Released: 10/01/2007 Document Revised: 05/08/2014 Document Reviewed: 12/18/2013 Elsevier Interactive Patient Education [employer]

## 2015-04-16 NOTE — Telephone Encounter (Signed)
He said he is not worse. York SpanielSaid he is feeling a little better. I explained a virus can take 7 to 14 days to run its course. He will call for an appointment if he stops improving.

## 2015-04-16 NOTE — Telephone Encounter (Signed)
Patient Name: Carl Ramos DOB: 1965-11-18 Initial Comment caller states he has a cough Nurse Assessment Nurse: Elijah Birkaldwell, RN, Lynda Date/Time (Eastern Time): 04/16/2015 12:24:33 PM Confirm and document reason for call. If symptomatic, describe symptoms. You must click the next button to save text entered. ---Caller states he has a cough, has had upper respiratory symptoms, sinus, fever, etc. Was seen, finished Z pack yesterday. Still feverish, taking Albuterol, cough rx. About to be traveling. Has the patient traveled out of the country within the last 30 days? ---Not Applicable Does the patient have any new or worsening symptoms? ---Yes Will a triage be completed? ---Yes Related visit to physician within the last 2 weeks? ---Yes Does the PT have any chronic conditions? (i.e. diabetes, asthma, etc.) ---No Is this a behavioral health or substance abuse call? ---No Guidelines Guideline Title Affirmed Question Affirmed Notes Infection on Antibiotic Follow-up Call [1] Taking antibiotic > 48 hours (2 days) AND [2] fever still present (SAME) Final Disposition User Call PCP within 24 Hours [place], RN, Hilton HotelsLynda Comments Caller states he is about to travel, and symptoms have not cleared up as they usually do. Still feverish. He can be reached at this #, but would like to know what to do from here. Disagree/Comply: Comply

## 2015-04-16 NOTE — ED Notes (Signed)
Pt arrives to ER via POV c/o left leg injury after fall. Pt denies LOC, denies hitting head. Laceration to right leg, bleeding controlled, right arm also has abrasions to elbow. Pt alert and oriented X4, active, cooperative, pt in NAD. RR even and unlabored, color WNL.

## 2015-04-17 ENCOUNTER — Telehealth: Payer: Self-pay

## 2015-04-17 NOTE — Telephone Encounter (Signed)
Patient said he's sore.  Patient said he was suppose to follow up with Dr.Letvak in a couple days.  I let patient know he could see someone else since Dr.Letvak's on vacation.  Patient said he'd wait for Dr.Letvak and scheduled appointment on 04/23/15 at 8:00.

## 2015-04-17 NOTE — Telephone Encounter (Signed)
Left a message for patient to call back. Dr Alphonsus SiasLetvak was wanting to know how he was doing after his trip to the ER

## 2015-04-19 NOTE — Telephone Encounter (Signed)
okay

## 2015-04-23 ENCOUNTER — Ambulatory Visit (INDEPENDENT_AMBULATORY_CARE_PROVIDER_SITE_OTHER): Payer: 59 | Admitting: Internal Medicine

## 2015-04-23 ENCOUNTER — Encounter: Payer: Self-pay | Admitting: Internal Medicine

## 2015-04-23 VITALS — BP 118/80 | HR 61 | Temp 98.6°F | Wt 252.0 lb

## 2015-04-23 DIAGNOSIS — S8391XA Sprain of unspecified site of right knee, initial encounter: Secondary | ICD-10-CM

## 2015-04-23 NOTE — Progress Notes (Signed)
Pre visit review using our clinic review tool, if applicable. No additional management support is needed unless otherwise documented below in the visit note. 

## 2015-04-23 NOTE — Progress Notes (Signed)
   Subjective:    Patient ID: Carl Ramos, male    DOB: January 16, 1965, 50 y.o.   MRN: [id number]  HPI ER follow up  [place] with right leg twisted Stress on inside of right knee Couldn't stand up at first---kept weight off it and climbed in car (wife there) and then to ER Laceration glued  Has been wrapping with ACE bandage and using brace Able to bear weight okay  Taking ibuprofen 600mg  every 6 hours No ice--not really swollen  Current Outpatient Prescriptions on File Prior to Visit  Medication Sig Dispense Refill  . acetaminophen (TYLENOL) 500 MG tablet Take 500 mg by mouth every 6 (six) hours as needed.    Marland Kitchen. ibuprofen (ADVIL,MOTRIN) 800 MG tablet Take 1 tablet (800 mg total) by mouth every 8 (eight) hours as needed (pain). 60 tablet 0  . Multiple Vitamin (MULTIVITAMIN) tablet Take 1 tablet by mouth daily.    Marland Kitchen. omeprazole (PRILOSEC) 20 MG capsule Take 20 mg by mouth daily.     No current facility-administered medications on file prior to visit.    No Known Allergies  Past Medical History  Diagnosis Date  . History of hemorrhoids   . GERD (gastroesophageal reflux disease)   . MRSA cellulitis 2008    hospitalized for presumed MRSA infection  . Pericarditis     Past Surgical History  Procedure Laterality Date  . Hemorrhoid surgery  ~2005  . Sigmoidoscopy  1/13    normal    Family History  Problem Relation Age of Onset  . Cancer Mother [id number]    colon  . COPD Mother   . Early death Father 346  . Aneurysm Father     cerebral  . Heart disease Maternal Grandfather   . Diabetes Paternal Grandmother     Social History   Social History  . Marital Status: Married    Spouse Name: N/A  . Number of Children: 2  . 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 Smoking status: Former Games developermoker  . Smokeless tobacco: Never Used     Comment: quit 1995   . Alcohol Use: 0.0 oz/week    0 Standard drinks or equivalent per week  . Drug Use: No  . Sexual Activity: Not on file   Other Topics Concern  . Not on file   Social History Narrative   Review of Systems  No fever Doesn't feel sick     Objective:   Physical Exam  Musculoskeletal:  Normal ROM right hip and ankle Right knee--- full passive ROM. No ligament laxity or meniscus findings. No effusion.  Neurological:  Fairly normal gait No weakness  Skin:  Healing laceration and abrasions in mid right calf--no infection          Assessment & Plan:

## 2015-04-23 NOTE — Assessment & Plan Note (Signed)
No evidence of internal derangement Can continue the ibuprofen prn Brace if it feels better He will slowly increase his activities

## 2015-04-23 NOTE — Patient Instructions (Signed)
Please call for appointment with Dr Patsy Lageropland if your knee and leg don't feel fairly normal in 2 weeks or so

## 2015-10-11 ENCOUNTER — Encounter: Payer: 59 | Admitting: Internal Medicine
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
## 2021-01-05 DEATH — deceased
